# Patient Record
Sex: Female | Born: 1995 | Race: Black or African American | Hispanic: No | Marital: Single | Smoking: Former smoker
Health system: Southern US, Community
[De-identification: ages and names within clinical notes are randomized; demographics above are authoritative.]

## PROBLEM LIST (undated history)

## (undated) DIAGNOSIS — F32A Depression, unspecified: Secondary | ICD-10-CM

## (undated) DIAGNOSIS — F329 Major depressive disorder, single episode, unspecified: Secondary | ICD-10-CM

## (undated) DIAGNOSIS — J45909 Unspecified asthma, uncomplicated: Secondary | ICD-10-CM

## (undated) DIAGNOSIS — F319 Bipolar disorder, unspecified: Secondary | ICD-10-CM

---
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
## 2015-05-06 ENCOUNTER — Ambulatory Visit (HOSPITAL_COMMUNITY)
Admission: EM | Admit: 2015-05-06 | Discharge: 2015-05-06 | Disposition: A | Discharge status: Home / Self Care | Payer: Medicaid Other | Attending: Emergency Medicine | Admitting: Emergency Medicine

## 2015-05-06 ENCOUNTER — Encounter (HOSPITAL_COMMUNITY): Payer: Self-pay

## 2015-05-06 DIAGNOSIS — Z79899 Other long term (current) drug therapy: Secondary | ICD-10-CM | POA: Diagnosis not present

## 2015-05-06 DIAGNOSIS — Z87440 Personal history of urinary (tract) infections: Secondary | ICD-10-CM | POA: Insufficient documentation

## 2015-05-06 DIAGNOSIS — F329 Major depressive disorder, single episode, unspecified: Secondary | ICD-10-CM | POA: Insufficient documentation

## 2015-05-06 DIAGNOSIS — N898 Other specified noninflammatory disorders of vagina: Secondary | ICD-10-CM | POA: Insufficient documentation

## 2015-05-06 DIAGNOSIS — J45909 Unspecified asthma, uncomplicated: Secondary | ICD-10-CM | POA: Diagnosis not present

## 2015-05-06 DIAGNOSIS — Z888 Allergy status to other drugs, medicaments and biological substances status: Secondary | ICD-10-CM | POA: Insufficient documentation

## 2015-05-06 DIAGNOSIS — Z202 Contact with and (suspected) exposure to infections with a predominantly sexual mode of transmission: Secondary | ICD-10-CM

## 2015-05-06 LAB — POCT URINALYSIS DIP (DEVICE)
Bilirubin Urine: NEGATIVE
GLUCOSE, UA: NEGATIVE mg/dL
Ketones, ur: NEGATIVE mg/dL
Leukocytes, UA: NEGATIVE
NITRITE: NEGATIVE
PROTEIN: NEGATIVE mg/dL
SPECIFIC GRAVITY, URINE: 1.025 (ref 1.005–1.030)
UROBILINOGEN UA: 0.2 mg/dL (ref 0.0–1.0)
pH: 6.5 (ref 5.0–8.0)

## 2015-05-06 LAB — POCT PREGNANCY, URINE: PREG TEST UR: NEGATIVE

## 2015-05-06 MED ORDER — AZITHROMYCIN 250 MG PO TABS
ORAL_TABLET | ORAL | Status: AC
  Filled 2015-05-06: qty 4

## 2015-05-06 MED ORDER — LIDOCAINE HCL (PF) 1 % IJ SOLN
INTRAMUSCULAR | Status: AC
  Filled 2015-05-06: qty 5

## 2015-05-06 MED ORDER — CEFTRIAXONE SODIUM 250 MG IJ SOLR
INTRAMUSCULAR | Status: AC
  Filled 2015-05-06: qty 250

## 2015-05-06 MED ORDER — CEFTRIAXONE SODIUM 250 MG IJ SOLR
250.0000 mg | Freq: Once | INTRAMUSCULAR | Status: AC
  Administered 2015-05-06: 250 mg via INTRAMUSCULAR

## 2015-05-06 MED ORDER — AZITHROMYCIN 250 MG PO TABS
1000.0000 mg | ORAL_TABLET | Freq: Once | ORAL | Status: AC
  Administered 2015-05-06: 1000 mg via ORAL

## 2015-05-06 NOTE — ED Provider Notes (Signed)
CSN: [id number]     Arrival date & time 05/06/15  1818 History   First MD Initiated Contact with Patient 05/06/15 1921     Chief Complaint  Patient presents with  . Exposure to STD   (Consider location/radiation/quality/duration/timing/severity/associated sxs/prior Treatment) HPI  She is a 20 year old woman here forevaluation of vaginal discharge. She reports about one week of abdominal discomfort and discharge. The discharge is clear to white and runny with an odor. She denies any significant itching.Her boyfriend was recently diagnosed with gonorrhea. She was recently treated for a UTI.   Past Medical History  Diagnosis Date  . Depression   . Asthma    History reviewed. No pertinent past surgical history. No family history on file. Social History  Substance Use Topics  . Smoking status: Never Smoker   . Smokeless tobacco: Never Used  . Alcohol Use: Yes     Comment: occasional   OB History    Gravida Para Term Preterm AB TAB SAB Ectopic Multiple Living   1              Review of Systems as in history of present illness Allergies  Oxycodone  Home Medications   Prior to Admission medications   Medication Sig Start Date End Date Taking? Authorizing Provider  albuterol (PROVENTIL HFA;VENTOLIN HFA) 108 (90 BASE) MCG/ACT inhaler Inhale 2 puffs into the lungs every 6 (six) hours as needed. For shortness of breath   Yes Historical Provider, MD  beclomethasone (QVAR) 80 MCG/ACT inhaler Inhale 1 puff into the lungs daily.    Historical Provider, MD  cyclobenzaprine (FLEXERIL) 10 MG tablet Take 0.5-1 tablets (5-10 mg total) by mouth 2 (two) times daily as needed for muscle spasms. 11/25/13   Arthor CaptainAbigail Harris, PA-C  Divalproex Sodium (DEPAKOTE PO) Take 500 mg by mouth 3 (three) times daily.    Historical Provider, MD  FLUoxetine (PROZAC) 10 MG capsule Take 10 mg by mouth daily.    Historical Provider, MD  fluticasone (FLONASE) 50 MCG/ACT nasal spray Place 2 sprays into the nose daily.     Historical Provider, MD  hydrOXYzine (VISTARIL) 25 MG capsule Take 25 mg by mouth 4 (four) times daily as needed. For anxiety    Historical Provider, MD  naproxen (NAPROSYN) 500 MG tablet Take 1 tablet (500 mg total) by mouth 2 (two) times daily with a meal. 11/25/13   Arthor CaptainAbigail Harris, PA-C   Meds Ordered and Administered this Visit   Medications  azithromycin (ZITHROMAX) tablet 1,000 mg (1,000 mg Oral Given 05/06/15 1953)  cefTRIAXone (ROCEPHIN) injection 250 mg (250 mg Intramuscular Given 05/06/15 1954)    BP 119/84 mmHg  Pulse 93  Temp(Src) 98.9 F (37.2 C) (Oral)  Resp 16  SpO2 100%  LMP 05/02/2015 (Exact Date) No data found.   Physical Exam  Constitutional: She is oriented to person, place, and time. She appears well-developed and well-nourished. No distress.  Cardiovascular: Normal rate.   Pulmonary/Chest: Effort normal.  Genitourinary: There is no rash on the right labia. There is no rash on the left labia. Cervix exhibits no motion tenderness. There is bleeding (patient on period) in the vagina. No vaginal discharge found.  Neurological: She is alert and oriented to person, place, and time.    ED Course  Procedures (including critical care time)  Labs Review Labs Reviewed  POCT URINALYSIS DIP (DEVICE) - Abnormal; Notable for the following:    Hgb urine dipstick SMALL (*)    All other components within normal limits  CERVICOVAGINAL ANCILLARY ONLY    Imaging Review No results found.    MDM   1. STD exposure    Treated with Rocephin and azithromycin. Follow-up as needed.   Charm Rings, MD 05/06/15 [phone]

## 2015-05-06 NOTE — Discharge Instructions (Signed)
We treated you for gonorrhea and Chlamydia today. We will call you with your results in 2-3 days. Follow-up as needed.

## 2015-05-06 NOTE — ED Notes (Signed)
Patient presents with exposure to STD and would like to be tested, she is having lower abdominal pain and discharge x1 week. No acute distress

## 2015-05-08 LAB — URINE CULTURE

## 2015-05-09 LAB — CERVICOVAGINAL ANCILLARY ONLY
CHLAMYDIA, DNA PROBE: NEGATIVE
NEISSERIA GONORRHEA: NEGATIVE

## 2015-05-10 LAB — CERVICOVAGINAL ANCILLARY ONLY: WET PREP (BD AFFIRM): POSITIVE — AB

## 2015-05-11 ENCOUNTER — Telehealth (HOSPITAL_COMMUNITY): Payer: Self-pay | Admitting: Internal Medicine

## 2015-05-11 ENCOUNTER — Telehealth (HOSPITAL_COMMUNITY): Payer: Self-pay | Admitting: Emergency Medicine

## 2015-05-11 DIAGNOSIS — A599 Trichomoniasis, unspecified: Secondary | ICD-10-CM

## 2015-05-11 MED ORDER — METRONIDAZOLE 500 MG PO TABS: 500.0000 mg | ORAL_TABLET | Freq: Two times a day (BID) | ORAL | Status: AC

## 2015-05-11 NOTE — ED Notes (Signed)
Called [phone]... VM is full; unable to leave message Need to give lab results from recent visit on 4/21  Per Dr. Dayton ScrapeMurray,  Clinical staff, please let patient know that test for trichomonas was positive.  Rx for metronidazole was [address] the pharmacy of record, [facility] [address]  Recheck or followup pcp/Ariel Dorothyann GibbsFinch for further evaluation if symptoms persist. LM

## 2015-05-11 NOTE — ED Notes (Signed)
Clinical staff, please let patient know that test for trichomonas was positive.   Rx for metronidazole was sent to the pharmacy of record, [facility] [address]   Recheck or followup pcp/Lorri Dorothyann GibbsFinch for further evaluation if symptoms persist.  LM  Eustace MooreLaura W Rafaelita Foister, MD 05/11/15 0006

## 2015-05-20 NOTE — ED Notes (Unsigned)
Called [phone]... VM is full; unable to leave message Need to give lab results from recent visit on 4/21  Per Dr. Dayton ScrapeMurray,  Clinical staff, please let patient know that test for trichomonas was positive.  Rx for metronidazole was sent to the pharmacy of record, [facility] [address]  Recheck or followup pcp/Lorri Dorothyann GibbsFinch for further evaluation if symptoms persist. LM  Mailed letter as 3rd attempt

## 2016-02-27 ENCOUNTER — Emergency Department (HOSPITAL_BASED_OUTPATIENT_CLINIC_OR_DEPARTMENT_OTHER)
Admission: EM | Admit: 2016-02-27 | Discharge: 2016-02-27 | Disposition: A | Discharge status: Home / Self Care | Payer: Medicaid Other | Attending: Dermatology | Admitting: Dermatology

## 2016-02-27 ENCOUNTER — Encounter (HOSPITAL_BASED_OUTPATIENT_CLINIC_OR_DEPARTMENT_OTHER): Payer: Self-pay | Admitting: *Deleted

## 2016-02-27 ENCOUNTER — Emergency Department (HOSPITAL_BASED_OUTPATIENT_CLINIC_OR_DEPARTMENT_OTHER)
Admission: EM | Admit: 2016-02-27 | Discharge: 2016-02-27 | Disposition: A | Discharge status: Home / Self Care | Payer: Medicaid Other | Source: Home / Self Care

## 2016-02-27 DIAGNOSIS — Z5321 Procedure and treatment not carried out due to patient leaving prior to being seen by health care provider: Secondary | ICD-10-CM | POA: Insufficient documentation

## 2016-02-27 DIAGNOSIS — R21 Rash and other nonspecific skin eruption: Secondary | ICD-10-CM | POA: Insufficient documentation

## 2016-02-27 DIAGNOSIS — J45909 Unspecified asthma, uncomplicated: Secondary | ICD-10-CM | POA: Diagnosis not present

## 2016-02-27 DIAGNOSIS — Z791 Long term (current) use of non-steroidal anti-inflammatories (NSAID): Secondary | ICD-10-CM | POA: Insufficient documentation

## 2016-02-27 DIAGNOSIS — Z79899 Other long term (current) drug therapy: Secondary | ICD-10-CM | POA: Insufficient documentation

## 2016-02-27 DIAGNOSIS — Z4800 Encounter for change or removal of nonsurgical wound dressing: Secondary | ICD-10-CM | POA: Diagnosis present

## 2016-02-27 NOTE — ED Notes (Signed)
No answer in lobby.

## 2016-02-27 NOTE — ED Triage Notes (Signed)
States her right nipple ring is infected.

## 2016-02-27 NOTE — ED Notes (Signed)
Pt not in lobby.  

## 2016-02-27 NOTE — ED Triage Notes (Signed)
Right nipple ring is infected. She was here earlier and left without being seen to get her child.

## 2016-02-27 NOTE — ED Notes (Signed)
Pt does not answer in lobby. Pt did not let any one know she was leaving.

## 2016-02-27 NOTE — ED Notes (Signed)
No answer when called for a room. 

## 2016-02-27 NOTE — ED Notes (Signed)
Pt still not in lobby.

## 2016-09-24 ENCOUNTER — Encounter (HOSPITAL_COMMUNITY): Payer: Self-pay | Admitting: Emergency Medicine

## 2016-09-24 ENCOUNTER — Emergency Department (HOSPITAL_COMMUNITY): Payer: Medicaid Other

## 2016-09-24 ENCOUNTER — Emergency Department (HOSPITAL_COMMUNITY)
Admission: EM | Admit: 2016-09-24 | Discharge: 2016-09-24 | Disposition: A | Discharge status: Home / Self Care | Payer: Medicaid Other | Attending: Physician Assistant | Admitting: Physician Assistant

## 2016-09-24 DIAGNOSIS — Z23 Encounter for immunization: Secondary | ICD-10-CM | POA: Insufficient documentation

## 2016-09-24 DIAGNOSIS — J45909 Unspecified asthma, uncomplicated: Secondary | ICD-10-CM | POA: Insufficient documentation

## 2016-09-24 DIAGNOSIS — Y999 Unspecified external cause status: Secondary | ICD-10-CM | POA: Insufficient documentation

## 2016-09-24 DIAGNOSIS — Y929 Unspecified place or not applicable: Secondary | ICD-10-CM | POA: Insufficient documentation

## 2016-09-24 DIAGNOSIS — M25522 Pain in left elbow: Secondary | ICD-10-CM | POA: Insufficient documentation

## 2016-09-24 DIAGNOSIS — Z79899 Other long term (current) drug therapy: Secondary | ICD-10-CM | POA: Diagnosis not present

## 2016-09-24 DIAGNOSIS — Y9389 Activity, other specified: Secondary | ICD-10-CM | POA: Insufficient documentation

## 2016-09-24 DIAGNOSIS — R51 Headache: Secondary | ICD-10-CM | POA: Insufficient documentation

## 2016-09-24 DIAGNOSIS — R2242 Localized swelling, mass and lump, left lower limb: Secondary | ICD-10-CM | POA: Diagnosis not present

## 2016-09-24 HISTORY — DX: Bipolar disorder, unspecified: F31.9

## 2016-09-24 LAB — I-STAT BETA HCG BLOOD, ED (MC, WL, AP ONLY)

## 2016-09-24 LAB — ETHANOL: ALCOHOL ETHYL (B): 69 mg/dL — AB (ref <5)

## 2016-09-24 MED ORDER — OXYCODONE-ACETAMINOPHEN 5-325 MG PO TABS
1.0000 | ORAL_TABLET | ORAL | Status: DC | PRN
Start: 2016-09-24 — End: 2016-09-24
  Administered 2016-09-24: 1 via ORAL

## 2016-09-24 MED ORDER — IBUPROFEN 800 MG PO TABS: 800.0000 mg | ORAL_TABLET | Freq: Three times a day (TID) | ORAL | 0 refills | Status: DC

## 2016-09-24 MED ORDER — TETANUS-DIPHTH-ACELL PERTUSSIS 5-2.5-18.5 LF-MCG/0.5 IM SUSP
0.5000 mL | Freq: Once | INTRAMUSCULAR | Status: AC
  Administered 2016-09-24: 0.5 mL via INTRAMUSCULAR
  Filled 2016-09-24: qty 0.5

## 2016-09-24 MED ORDER — OXYCODONE-ACETAMINOPHEN 5-325 MG PO TABS
ORAL_TABLET | ORAL | Status: AC
  Filled 2016-09-24: qty 1
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
## 2016-09-24 NOTE — Discharge Instructions (Signed)
Return with any concerns. °
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
## 2016-09-24 NOTE — ED Notes (Signed)
Pt given narcotic pain medicine in triage. Advised of side effects and instructed to avoid driving for a minimum of four hours.  

## 2016-10-09 ENCOUNTER — Emergency Department (HOSPITAL_BASED_OUTPATIENT_CLINIC_OR_DEPARTMENT_OTHER)
Admission: EM | Admit: 2016-10-09 | Discharge: 2016-10-10 | Disposition: A | Discharge status: Home / Self Care | Payer: Medicaid Other | Attending: Emergency Medicine | Admitting: Emergency Medicine

## 2016-10-09 ENCOUNTER — Encounter (HOSPITAL_BASED_OUTPATIENT_CLINIC_OR_DEPARTMENT_OTHER): Payer: Self-pay

## 2016-10-09 DIAGNOSIS — Y999 Unspecified external cause status: Secondary | ICD-10-CM | POA: Diagnosis not present

## 2016-10-09 DIAGNOSIS — Y939 Activity, unspecified: Secondary | ICD-10-CM | POA: Diagnosis not present

## 2016-10-09 DIAGNOSIS — N760 Acute vaginitis: Secondary | ICD-10-CM | POA: Diagnosis not present

## 2016-10-09 DIAGNOSIS — J45909 Unspecified asthma, uncomplicated: Secondary | ICD-10-CM | POA: Diagnosis not present

## 2016-10-09 DIAGNOSIS — S3141XA Laceration without foreign body of vagina and vulva, initial encounter: Secondary | ICD-10-CM | POA: Diagnosis not present

## 2016-10-09 DIAGNOSIS — Z79899 Other long term (current) drug therapy: Secondary | ICD-10-CM | POA: Insufficient documentation

## 2016-10-09 DIAGNOSIS — X58XXXA Exposure to other specified factors, initial encounter: Secondary | ICD-10-CM | POA: Diagnosis not present

## 2016-10-09 DIAGNOSIS — F17210 Nicotine dependence, cigarettes, uncomplicated: Secondary | ICD-10-CM | POA: Insufficient documentation

## 2016-10-09 DIAGNOSIS — Y929 Unspecified place or not applicable: Secondary | ICD-10-CM | POA: Insufficient documentation

## 2016-10-09 DIAGNOSIS — B9689 Other specified bacterial agents as the cause of diseases classified elsewhere: Secondary | ICD-10-CM

## 2016-10-09 DIAGNOSIS — N898 Other specified noninflammatory disorders of vagina: Secondary | ICD-10-CM | POA: Diagnosis present

## 2016-10-09 LAB — URINALYSIS, ROUTINE W REFLEX MICROSCOPIC
Bilirubin Urine: NEGATIVE
Glucose, UA: NEGATIVE mg/dL
Hgb urine dipstick: NEGATIVE
Ketones, ur: NEGATIVE mg/dL
NITRITE: NEGATIVE
PH: 7.5 (ref 5.0–8.0)
Protein, ur: NEGATIVE mg/dL
SPECIFIC GRAVITY, URINE: 1.015 (ref 1.005–1.030)

## 2016-10-09 LAB — WET PREP, GENITAL
Sperm: NONE SEEN
Trich, Wet Prep: NONE SEEN
YEAST WET PREP: NONE SEEN

## 2016-10-09 LAB — URINALYSIS, MICROSCOPIC (REFLEX): RBC / HPF: NONE SEEN RBC/hpf (ref 0–5)

## 2016-10-09 LAB — PREGNANCY, URINE: Preg Test, Ur: NEGATIVE

## 2016-10-09 MED ORDER — CEFTRIAXONE SODIUM 250 MG IJ SOLR
250.0000 mg | Freq: Once | INTRAMUSCULAR | Status: AC
  Administered 2016-10-09: 250 mg via INTRAMUSCULAR
  Filled 2016-10-09: qty 250

## 2016-10-09 MED ORDER — AZITHROMYCIN 250 MG PO TABS
1000.0000 mg | ORAL_TABLET | Freq: Once | ORAL | Status: AC
  Administered 2016-10-09: 1000 mg via ORAL
  Filled 2016-10-09: qty 4

## 2016-10-09 MED ORDER — METRONIDAZOLE 500 MG PO TABS: 500.0000 mg | ORAL_TABLET | Freq: Two times a day (BID) | ORAL | 0 refills | Status: DC

## 2016-10-09 NOTE — ED Provider Notes (Signed)
[facility]   CSN: [id number] Arrival date & time: 10/09/16  2025     History   Chief Complaint Chief Complaint  Patient presents with  . Vaginal Discharge    HPI Ariel Randolph is a 21 y.o. female who presents with 4 days of vaginal discharge and irritation. Patient reports that she has been experiencing some white discharge for the last 4 days. She thought she was initially having yeast infection as she recently gets them. She reports that she is one over-the-counter yeast pill with minimal improvement in symptoms. Patient also reports some irritation to the lower left aspect of her vagina. She denies any rashes, sores. Patient reports that she is currently sexually active with multiple partners. She reports that she intermittently will use condoms. Patient does have an history of Chlamydia treated several years ago. Patient denies any fevers, chills, abdominal pain, nausea/vomiting, dysuria, hematuria, vaginal bleeding.  The history is provided by the patient.    Past Medical History:  Diagnosis Date  . Asthma   . Bipolar 1 disorder (HCC)   . Depression     There are no active problems to display for this patient.   History reviewed. No pertinent surgical history.  OB History    Gravida Para Term Preterm AB Living   1             SAB TAB Ectopic Multiple Live Births                   Home Medications    Prior to Admission medications   Medication Sig Start Date End Date Taking? Authorizing Provider  divalproex (DEPAKOTE ER) 500 MG 24 hr tablet Take 500 mg by mouth 2 (two) times daily. 06/28/16   [provider]  ibuprofen (ADVIL,MOTRIN) 800 MG tablet Take 1 tablet (800 mg total) by mouth 3 (three) times daily. 09/24/16   Mackuen, Courteney Lyn, MD  metroNIDAZOLE (FLAGYL) 500 MG tablet Take 1 tablet (500 mg total) by mouth 2 (two) times daily. 10/09/16   Maxwell Caul, PA-C    Family History No family history on  file.  Social History Social History  Substance Use Topics  . Smoking status: Current Every Day Smoker    Packs/day: 0.50    Types: Cigarettes  . Smokeless tobacco: Never Used  . Alcohol use Yes     Comment: occasional     Allergies   Oxycodone and Codeine   Review of Systems Review of Systems  Constitutional: Negative for fever.  Respiratory: Negative for shortness of breath.   Cardiovascular: Negative for chest pain.  Gastrointestinal: Negative for abdominal pain, nausea and vomiting.  Genitourinary: Positive for vaginal discharge. Negative for dysuria and hematuria.     Physical Exam Updated Vital Signs BP 134/67   Pulse 86   Temp 99.2 F (37.3 C)   Resp 20   Ht  (1.702 m)   Wt 102.1 kg (225 lb)   LMP 09/15/2016 (Within Days)   SpO2 100%   BMI 35.24 kg/m   Physical Exam  Constitutional: She is oriented to person, place, and time. She appears well-developed and well-nourished.  HENT:  Head: Normocephalic and atraumatic.  Mouth/Throat: Oropharynx is clear and moist and mucous membranes are normal.  Eyes: Pupils are equal, round, and reactive to light. Conjunctivae, EOM and lids are normal.  Neck: Full passive range of motion without pain.  Cardiovascular: Normal rate, regular rhythm, normal heart sounds and normal pulses.  Exam  reveals no gallop and no friction rub.   No murmur heard. Pulmonary/Chest: Effort normal and breath sounds normal.  Abdominal: Soft. Normal appearance. There is no tenderness. There is no rigidity and no guarding.  Genitourinary: Rectum normal and uterus normal. Rectal exam shows no fissure, no mass and no tenderness.    Cervix exhibits discharge. Cervix exhibits no motion tenderness and no friability. Right adnexum displays no mass and no tenderness. Left adnexum displays no mass and no tenderness. No bleeding in the vagina. There are signs of injury in the vagina.  Genitourinary Comments: The exam was performed with a chaperone  present. Normal external female genitalia. No lesions, rash, or sores. There is a small tear noted to the vaginal mucosa into the right lower aspect. The patient does have some mucoid discharge present. Cervix does not appear erythematous or friability. No CMT. No adnexal mass, tenderness bilaterally. Uterus is normal.   Musculoskeletal: Normal range of motion.  Neurological: She is alert and oriented to person, place, and time.  Skin: Skin is warm and dry. Capillary refill takes less than 2 seconds.  Psychiatric: She has a normal mood and affect. Her speech is normal.  Nursing note and vitals reviewed.    ED Treatments / Results  Labs (all labs ordered are listed, but only abnormal results are displayed) Labs Reviewed  WET PREP, GENITAL - Abnormal; Notable for the following:       Result Value   Clue Cells Wet Prep HPF POC PRESENT (*)    WBC, Wet Prep HPF POC MANY (*)    All other components within normal limits  URINALYSIS, ROUTINE W REFLEX MICROSCOPIC - Abnormal; Notable for the following:    APPearance CLOUDY (*)    Leukocytes, UA SMALL (*)    All other components within normal limits  URINALYSIS, MICROSCOPIC (REFLEX) - Abnormal; Notable for the following:    Bacteria, UA FEW (*)    Squamous Epithelial / LPF 6-30 (*)    All other components within normal limits  PREGNANCY, URINE  GC/CHLAMYDIA PROBE AMP ([place]) NOT AT [facility]    EKG  EKG Interpretation None       Radiology No results found.  Procedures Procedures (including critical care time)  Medications Ordered in ED Medications  cefTRIAXone (ROCEPHIN) injection 250 mg (250 mg Intramuscular Given 10/09/16 2252)  azithromycin (ZITHROMAX) tablet 1,000 mg (1,000 mg Oral Given 10/09/16 2251)     Initial Impression / Assessment and Plan / ED Course  I have reviewed the triage vital signs and the nursing notes.  Pertinent labs & imaging results that were available during my care of the patient were reviewed by me  and considered in my medical decision making (see chart for details).     21 year old female who presents with 4 days of vaginal irritation and discharge. No fevers chills, abdominal pain, nausea/vomiting, dysuria, hematuria. Patient is afebrile, non-toxic appearing, sitting comfortably on examination table. Vital signs reviewed and stable. Consider UTI versus STD versus PE versus pregnancy. Will plan to obtain a pelvic exam.  Pelvic exam as I can above. Pelvic exam is not concerning for PID, herpes. She does have some mucoid discharge present. There is a small tear to the lining of the vaginal mucosa on the right lower side. It does not require any suturing. It does not extend into the rectum. It is likely the cause of her burning sensation as physical exam is not concerning for herpes. Patient does wish to be treated for STDs  at this time. There is mention of multiple bumps in the triage note but patient denies any bumps or lesions during my interview. She states she is having one area of pain and "bump" where she points to the laceration.   Labs reviewed. Wet prep is positive for clue cells. Will plan to treat patient. Instructed patient on pelvic rest for symptomatic relief. Provided patient with a list of clinic resources to use if he does not have a PCP. Instructed to call them today to arrange follow-up in the next 24-48 hours. Strict return precautions discussed. Patient expresses understanding and agreement to plan.    Final Clinical Impressions(s) / ED Diagnoses   Final diagnoses:  BV (bacterial vaginosis)  Vaginal discharge  Laceration of vagina, initial encounter    New Prescriptions Discharge Medication List as of 10/09/2016 11:26 PM    START taking these medications   Details  metroNIDAZOLE (FLAGYL) 500 MG tablet Take 1 tablet (500 mg total) by mouth 2 (two) times daily., Starting Tue 10/09/2016, Print         Maxwell Caul, PA-C 10/11/16 0000    Jacalyn Lefevre,  MD 10/14/16 [phone]
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
## 2016-10-09 NOTE — Discharge Instructions (Signed)
Take Flagyl as directed.  It is very important that you do not consume any alcohol while taking this medication as it will cause you to become violently ill.  As we discussed, you need to engage in pelvic rest for the next week.   You have been treated today for an STD.   The test results with take 2-3 days to return. If there is an abnormal result, you will be notified. If you do not hear anything, that means the results were negative. You can also log on MyChart to see the results.   Your sexual partner needs to be treated too. Do not have sexual intercourse for the next 7 days and after your partner has been treated.   Follow-up with your primary care doctor in 2-4 days. If you do not have a primary care doctor, you can use one listed in the paperwork.   Return to the Emergency Department for any fever, abdominal pain, difficulty breathing, nausea/vomiting or any other worsening or concerning symptoms.

## 2016-10-09 NOTE — ED Triage Notes (Signed)
Pt reports vaginal discharge and itching x 2 days.

## 2016-10-11 LAB — GC/CHLAMYDIA PROBE AMP (~~LOC~~) NOT AT ARMC
CHLAMYDIA, DNA PROBE: NEGATIVE
Neisseria Gonorrhea: NEGATIVE

## 2017-02-25 ENCOUNTER — Encounter (HOSPITAL_BASED_OUTPATIENT_CLINIC_OR_DEPARTMENT_OTHER): Payer: Self-pay | Admitting: Emergency Medicine

## 2017-02-25 ENCOUNTER — Other Ambulatory Visit: Payer: Self-pay

## 2017-02-25 ENCOUNTER — Emergency Department (HOSPITAL_BASED_OUTPATIENT_CLINIC_OR_DEPARTMENT_OTHER)
Admission: EM | Admit: 2017-02-25 | Discharge: 2017-02-25 | Disposition: A | Discharge status: Home / Self Care | Payer: Medicaid Other | Attending: Emergency Medicine | Admitting: Emergency Medicine

## 2017-02-25 DIAGNOSIS — R0602 Shortness of breath: Secondary | ICD-10-CM | POA: Insufficient documentation

## 2017-02-25 DIAGNOSIS — Z5321 Procedure and treatment not carried out due to patient leaving prior to being seen by health care provider: Secondary | ICD-10-CM | POA: Diagnosis not present

## 2017-02-25 NOTE — ED Notes (Signed)
Called to re-vital. No answer.  Pt not in lobby.
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
## 2018-07-19 ENCOUNTER — Emergency Department (HOSPITAL_BASED_OUTPATIENT_CLINIC_OR_DEPARTMENT_OTHER)
Admission: EM | Admit: 2018-07-19 | Discharge: 2018-07-20 | Disposition: A | Discharge status: Home / Self Care | Payer: Medicaid Other | Attending: Emergency Medicine | Admitting: Emergency Medicine

## 2018-07-19 ENCOUNTER — Encounter (HOSPITAL_BASED_OUTPATIENT_CLINIC_OR_DEPARTMENT_OTHER): Payer: Self-pay | Admitting: *Deleted

## 2018-07-19 ENCOUNTER — Other Ambulatory Visit: Payer: Self-pay

## 2018-07-19 DIAGNOSIS — O26899 Other specified pregnancy related conditions, unspecified trimester: Secondary | ICD-10-CM

## 2018-07-19 DIAGNOSIS — R1030 Lower abdominal pain, unspecified: Secondary | ICD-10-CM | POA: Insufficient documentation

## 2018-07-19 DIAGNOSIS — N898 Other specified noninflammatory disorders of vagina: Secondary | ICD-10-CM | POA: Diagnosis not present

## 2018-07-19 DIAGNOSIS — R109 Unspecified abdominal pain: Secondary | ICD-10-CM

## 2018-07-19 DIAGNOSIS — O039 Complete or unspecified spontaneous abortion without complication: Secondary | ICD-10-CM | POA: Diagnosis not present

## 2018-07-19 DIAGNOSIS — O9989 Other specified diseases and conditions complicating pregnancy, childbirth and the puerperium: Secondary | ICD-10-CM | POA: Diagnosis not present

## 2018-07-19 DIAGNOSIS — Z3A01 Less than 8 weeks gestation of pregnancy: Secondary | ICD-10-CM | POA: Diagnosis not present

## 2018-07-19 LAB — WET PREP, GENITAL
Sperm: NONE SEEN
Trich, Wet Prep: NONE SEEN
Yeast Wet Prep HPF POC: NONE SEEN

## 2018-07-19 LAB — URINALYSIS, ROUTINE W REFLEX MICROSCOPIC
Bilirubin Urine: NEGATIVE
Glucose, UA: NEGATIVE mg/dL
Hgb urine dipstick: NEGATIVE
Ketones, ur: NEGATIVE mg/dL
Leukocytes,Ua: NEGATIVE
Nitrite: NEGATIVE
Protein, ur: NEGATIVE mg/dL
Specific Gravity, Urine: >1.03 — ABNORMAL HIGH (ref 1.005–1.030)
pH: 6 (ref 5.0–8.0)

## 2018-07-19 LAB — PREGNANCY, URINE: Preg Test, Ur: POSITIVE — AB

## 2018-07-19 LAB — HCG, QUANTITATIVE, PREGNANCY: hCG, Beta Chain, Quant, S: 69 m[IU]/mL — ABNORMAL HIGH (ref <5)

## 2018-07-19 MED ORDER — ACETAMINOPHEN 325 MG PO TABS
650.0000 mg | ORAL_TABLET | Freq: Once | ORAL | Status: AC
  Administered 2018-07-19: 650 mg via ORAL
  Filled 2018-07-19: qty 2

## 2018-07-19 NOTE — ED Provider Notes (Signed)
[facility] EMERGENCY DEPARTMENT Provider Note   CSN: [id number] Arrival date & time: 07/19/18  2242    History   Chief Complaint Chief Complaint  Patient presents with   Abdominal Pain    HPI Ariel Randolph is a 23 y.o. female.     HPI  This is a 23 year old female G2, P1 who presents with abdominal pain.  Patient reports that she had a positive pregnancy test on Wednesday.  Her last menstrual period was May 27.  She does state that she had "a spot of bleeding when I was supposed to get my period in June."  She has not had any subsequent bleeding.  She reports over the last hour she has had sharp bilateral lower abdominal pain.  It is nonradiating.  She rates her pain at 7 out of 10.  She has some increased vaginal discharge.  Denies any urinary symptoms or back pain.  She denies fever, nausea, vomiting.  She is O- and has had RhoGam during her prior pregnancies.  Past Medical History:  Diagnosis Date   Asthma    Bipolar 1 disorder ([place])    Depression     There are no active problems to display for this patient.   History reviewed. No pertinent surgical history.   OB History    Gravida  2   Para      Term      Preterm      AB      Living        SAB      TAB      Ectopic      Multiple      Live Births               Home Medications    Prior to Admission medications   Medication Sig Start Date End Date Taking? Authorizing Provider  divalproex (DEPAKOTE ER) 500 MG 24 hr tablet Take 500 mg by mouth 2 (two) times daily. 06/28/16   [provider]  ibuprofen (ADVIL,MOTRIN) 800 MG tablet Take 1 tablet (800 mg total) by mouth 3 (three) times daily. 09/24/16   Mackuen, Courteney Lyn, MD  metroNIDAZOLE (FLAGYL) 500 MG tablet Take 500 mg by mouth 2 (two) times daily.    [provider]    Family History No family history on file.  Social History Social History   Tobacco Use   Smoking status: Former Smoker   Packs/day: 0.50    Types: Cigarettes   Smokeless tobacco: Never Used  Substance Use Topics   Alcohol use: Not Currently    Comment: occasional   Drug use: Yes    Types: Marijuana     Allergies   Oxycodone and Codeine   Review of Systems Review of Systems  Constitutional: Negative for fever.  Respiratory: Negative for shortness of breath.   Cardiovascular: Negative for chest pain.  Gastrointestinal: Positive for abdominal pain. Negative for nausea and vomiting.  Genitourinary: Positive for vaginal discharge. Negative for dysuria, hematuria and vaginal bleeding.  Psychiatric/Behavioral: The patient is not nervous/anxious.   All other systems reviewed and are negative.    Physical Exam Updated Vital Signs BP 121/61 (BP Location: Right Arm)    Pulse 88    Temp 98.3 F (36.8 C) (Oral)    Resp 18    Ht 1.753 m (5\' 9" )    Wt 99.8 kg    LMP 06/11/2018    SpO2 100%    BMI 32.49 kg/m  Physical Exam Vitals signs and nursing note reviewed.  Constitutional:      Appearance: She is well-developed.     Comments: Overweight  HENT:     Head: Normocephalic and atraumatic.  Neck:     Musculoskeletal: Neck supple.  Cardiovascular:     Rate and Rhythm: Normal rate and regular rhythm.     Heart sounds: Normal heart sounds.  Pulmonary:     Effort: Pulmonary effort is normal. No respiratory distress.     Breath sounds: No wheezing.  Abdominal:     General: Bowel sounds are normal.     Palpations: Abdomen is soft.     Tenderness: There is no abdominal tenderness.  Genitourinary:    Vagina: Vaginal discharge present. No bleeding.     Cervix: Normal.     Comments: Normal external vaginal exam, moderate white vaginal discharge noted, no vaginal bleeding noted, no cervical motion tenderness, no adnexal tenderness or masses noted Skin:    General: Skin is warm and dry.  Neurological:     Mental Status: She is alert and oriented to person, place, and time.      ED Treatments /  Results  Labs (all labs ordered are listed, but only abnormal results are displayed) Labs Reviewed  WET PREP, GENITAL - Abnormal; Notable for the following components:      Result Value   Clue Cells Wet Prep HPF POC PRESENT (*)    WBC, Wet Prep HPF POC MANY (*)    All other components within normal limits  URINALYSIS, ROUTINE W REFLEX MICROSCOPIC - Abnormal; Notable for the following components:   APPearance CLOUDY (*)    Specific Gravity, Urine >1.030 (*)    All other components within normal limits  PREGNANCY, URINE - Abnormal; Notable for the following components:   Preg Test, Ur POSITIVE (*)    All other components within normal limits  HCG, QUANTITATIVE, PREGNANCY - Abnormal; Notable for the following components:   hCG, Beta Chain, Quant, S 69 (*)    All other components within normal limits  GC/CHLAMYDIA PROBE AMP ([place]) NOT AT [facility]    EKG None  Radiology No results found.  Procedures Procedures (including critical care time)  EMERGENCY DEPARTMENT US PREGNANCY "Study: Limited Ultrasound of the Pelvis for Pregnancy"  INDICATIONS:Pregnancy(required) Multiple views of the uterus and pelvic cavity were obtained in real-time with a multi-frequency probe.  APPROACH:Transabdominal  PERFORMED BY: Myself IMAGES ARCHIVED?: No LIMITATIONS: Emergent procedure PREGNANCY FREE FLUID: None ADNEXAL FINDINGS:Left ovary not seen and Right ovary not seen GESTATIONAL AGE, ESTIMATE:  none FETAL HEART RATE: none INTERPRETATION: no free fluid, no intrauterine preg noted     Medications Ordered in ED Medications  acetaminophen (TYLENOL) tablet 650 mg (650 mg Oral Given 07/19/18 2338)     Initial Impression / Assessment and Plan / ED Course  I have reviewed the triage vital signs and the nursing notes.  Pertinent labs & imaging results that were available during my care of the patient were reviewed by me and considered in my medical decision making (see chart for  details).        Patient presents with abdominal pain in the setting of a recent positive home pregnancy test.  She is overall nontoxic-appearing and vital signs are reassuring.  She has no objective tenderness on exam and no adnexal tenderness.  She has no obvious bleeding on exam.  She is Rh- and has required RhoGam in the past.  Urine pregnancy test is positive.  Urinalysis without evidence of infection.  Beta-hCG level 69.  Based on her last menstrual period she would be approximately 5 weeks and 4 days pregnant and I would expect this beta-hCG level to be higher.  Considerations include early normal pregnancy, early pregnancy loss or miscarriage, early ectopic pregnancy.  Given that she is nontender, have low suspicion for ectopic at this time.  Bedside ultrasound was limited to transabdominal.  At this time I do not visualize an intrauterine pregnancy but given low beta hCG, this would not necessarily be abnormal.  Reassuring that she has no obvious masses or pelvic free fluid.  We will have her return for a formal ultrasound later today.  Her abdominal exam remains benign.  I discussed with her all these possibilities.  Given that she does not have any bleeding on exam, do not feel RhoGam is indicated at this time.  She was instructed to return immediately if she has any new or worsening pain or lateralizing pain or significant bleeding.  Stated understanding.  After history, exam, and medical workup I feel the patient has been appropriately medically screened and is safe for discharge home. Pertinent diagnoses were discussed with the patient. Patient was given return precautions.   Final Clinical Impressions(s) / ED Diagnoses   Final diagnoses:  Abdominal pain affecting pregnancy    ED Discharge Orders         Ordered    US PELVIC COMPLETE WITH TRANSVAGINAL     07/20/18 0052           Shon BatonHorton, Zylie Mumaw F, MD 07/20/18 0100

## 2018-07-19 NOTE — ED Triage Notes (Addendum)
Lower abd pain today. Vaginal d/c x 1 week. Reports + home preg test this week

## 2018-07-20 ENCOUNTER — Encounter (HOSPITAL_COMMUNITY): Payer: Self-pay | Admitting: *Deleted

## 2018-07-20 ENCOUNTER — Ambulatory Visit (HOSPITAL_BASED_OUTPATIENT_CLINIC_OR_DEPARTMENT_OTHER)
Admission: RE | Admit: 2018-07-20 | Discharge: 2018-07-20 | Disposition: A | Discharge status: Home / Self Care | Payer: Medicaid Other | Source: Ambulatory Visit | Attending: Emergency Medicine | Admitting: Emergency Medicine

## 2018-07-20 ENCOUNTER — Inpatient Hospital Stay (EMERGENCY_DEPARTMENT_HOSPITAL)
Admission: EM | Admit: 2018-07-20 | Discharge: 2018-07-21 | Disposition: A | Discharge status: Home / Self Care | Payer: Medicaid Other | Source: Home / Self Care | Admitting: Family Medicine

## 2018-07-20 ENCOUNTER — Other Ambulatory Visit: Payer: Self-pay

## 2018-07-20 DIAGNOSIS — R109 Unspecified abdominal pain: Secondary | ICD-10-CM | POA: Diagnosis present

## 2018-07-20 DIAGNOSIS — Z6791 Unspecified blood type, Rh negative: Secondary | ICD-10-CM

## 2018-07-20 DIAGNOSIS — O039 Complete or unspecified spontaneous abortion without complication: Secondary | ICD-10-CM

## 2018-07-20 DIAGNOSIS — Z87891 Personal history of nicotine dependence: Secondary | ICD-10-CM | POA: Insufficient documentation

## 2018-07-20 DIAGNOSIS — Z3A01 Less than 8 weeks gestation of pregnancy: Secondary | ICD-10-CM

## 2018-07-20 LAB — CBC
HCT: 34.3 % — ABNORMAL LOW (ref 36.0–46.0)
Hemoglobin: 10 g/dL — ABNORMAL LOW (ref 12.0–15.0)
MCH: 20.7 pg — ABNORMAL LOW (ref 26.0–34.0)
MCHC: 29.2 g/dL — ABNORMAL LOW (ref 30.0–36.0)
MCV: 70.9 fL — ABNORMAL LOW (ref 80.0–100.0)
Platelets: 454 10*3/uL — ABNORMAL HIGH (ref 150–400)
RBC: 4.84 MIL/uL (ref 3.87–5.11)
RDW: 18.8 % — ABNORMAL HIGH (ref 11.5–15.5)
WBC: 5.7 10*3/uL (ref 4.0–10.5)
nRBC: 0 % (ref 0.0–0.2)

## 2018-07-20 LAB — HCG, QUANTITATIVE, PREGNANCY: hCG, Beta Chain, Quant, S: 42 m[IU]/mL — ABNORMAL HIGH (ref <5)

## 2018-07-20 MED ORDER — RHO D IMMUNE GLOBULIN 1500 UNIT/2ML IJ SOSY
300.0000 ug | PREFILLED_SYRINGE | Freq: Once | INTRAMUSCULAR | Status: AC
  Administered 2018-07-20: 300 ug via INTRAMUSCULAR
  Filled 2018-07-20: qty 2

## 2018-07-20 NOTE — MAU Provider Note (Signed)
History     CSN: [id number]  Arrival date and time: 07/20/18 2012   First Provider Initiated Contact with Patient 07/20/18 2151     Chief Complaint  Patient presents with  . Vaginal Bleeding   Ariel Randolph is a 23 y.o. G2P1 at 7149w4d by LMP who presents to MAU with complaints of vaginal bleeding. She was [facility] yesterday for abdominal pain and noted to have a HCG of 69. US performed today at [facility], nothing was seen on US (no IUP and no ectopic). She reports abdominal pain resolved last night prior to leaving the ED. She reports vaginal bleeding started today around 2000, describes as dark burgundy bleeding, denies clots passing. She is Rh negative and did not receive Rhogam yesterday in ED.   OB History    Gravida  2   Para  1   Term  1   Preterm      AB      Living  1     SAB      TAB      Ectopic      Multiple      Live Births             Past Medical History:  Diagnosis Date  . Asthma   . Bipolar 1 disorder (HCC)   . Depression     History reviewed. No pertinent surgical history.  No family history on file.  Social History   Tobacco Use  . Smoking status: Former Smoker    Packs/day: 0.50    Types: Cigarettes    Quit date: 07/11/2018    Years since quitting: 0.0  . Smokeless tobacco: Never Used  Substance Use Topics  . Alcohol use: Not Currently    Comment: occasional  . Drug use: Yes    Types: Marijuana    Comment: last use 1 July 20    Allergies:  Allergies  Allergen Reactions  . Oxycodone Hives  . Codeine Rash    Medications Prior to Admission  Medication Sig Dispense Refill Last Dose  . metroNIDAZOLE (FLAGYL) 500 MG tablet Take 500 mg by mouth 2 (two) times daily.   Past Month at Unknown time  . divalproex (DEPAKOTE ER) 500 MG 24 hr tablet Take 500 mg by mouth 2 (two) times daily.  2 More than a month at Unknown time  . ibuprofen (ADVIL,MOTRIN) 800 MG tablet Take 1 tablet (800 mg total) by mouth 3 (three)  times daily. 21 tablet 0 More than a month at Unknown time    Review of Systems  Constitutional: Negative.   Respiratory: Negative.   Cardiovascular: Negative.   Gastrointestinal: Negative.   Genitourinary: Positive for vaginal bleeding. Negative for difficulty urinating, dysuria, frequency, hematuria, pelvic pain and urgency.  Neurological: Negative.    Physical Exam   Blood pressure 131/81, pulse 79, temperature 98.4 F (36.9 C), temperature source Oral, resp. rate 16, height 5\' 9"  (1.753 m), weight 95.7 kg, last menstrual period 06/11/2018.  Physical Exam  Nursing note and vitals reviewed. Constitutional: She is oriented to person, place, and time. She appears well-developed and well-nourished. No distress.  Cardiovascular: Normal rate and regular rhythm.  Respiratory: Effort normal and breath sounds normal. No respiratory distress. She has no wheezes.  GI: Soft. She exhibits no distension. There is no abdominal tenderness. There is no rebound and no guarding.  Genitourinary:    Vaginal bleeding present.  There is bleeding in the vagina.    Genitourinary Comments:  Pelvic exam: Cervix pink, visually closed, without lesion,  Small amount of bright red vaginal bleeding present, vaginal walls and external genitalia normal Bimanual exam: Cervix 0/long/high, firm, anterior, neg CMT, uterus nontender, nonenlarged, adnexa without tenderness, enlargement, or mass   Musculoskeletal: Normal range of motion.        General: No edema.  Neurological: She is alert and oriented to person, place, and time.  Psychiatric: She has a normal mood and affect. Her behavior is normal. Thought content normal.   MAU Course  Procedures  MDM Orders Placed This Encounter  Procedures  . hCG, quantitative, pregnancy  . CBC  . Rh IG workup (includes ABO/Rh)   Meds ordered this encounter  Medications  . rho (d) immune globulin (RHIG/RHOPHYLAC) injection 300 mcg   D/t recent labs and cultures last night  in ED no repeat wet prep, GC collected. Repeat HCG ordered to assess for decreased levels, even if HCG increase unable to determine success of pregnancy d/t less than 48 hours in between labs.   RHOgam workup and Rhogam given to patient prior to discharge home.  Labs reviewed:  Results for orders placed or performed during the hospital encounter of 07/20/18 (from the past 24 hour(s))  hCG, quantitative, pregnancy     Status: Abnormal   Collection Time: 07/20/18 10:12 PM  Result Value Ref Range   hCG, Beta Chain, Quant, S 42 (H) <5 mIU/mL  Rh IG workup (includes ABO/Rh)     Status: None (Preliminary result)   Collection Time: 07/20/18 10:12 PM  Result Value Ref Range   Gestational Age(Wks) 5    ABO/RH(D) B NEG    Antibody Screen NEG    Unit Number [id number]    Blood Component Type RHIG    Unit division 00    Status of Unit ISSUED    Transfusion Status      OK TO TRANSFUSE Performed at [facility], [address] [place], [place] [address]   CBC     Status: Abnormal   Collection Time: 07/20/18 10:12 PM  Result Value Ref Range   WBC 5.7 4.0 - 10.5 K/uL   RBC 4.84 3.87 - 5.11 MIL/uL   Hemoglobin 10.0 (L) 12.0 - 15.0 g/dL   HCT 30.834.3 (L) 65.736.0 - 84.646.0 %   MCV 70.9 (L) 80.0 - 100.0 fL   MCH 20.7 (L) 26.0 - 34.0 pg   MCHC 29.2 (L) 30.0 - 36.0 g/dL   RDW 96.218.8 (H) 95.211.5 - 84.115.5 %   Platelets 454 (H) 150 - 400 K/uL   nRBC 0.0 0.0 - 0.2 %   HCG dropped from 69 > 42, confirmed miscarriage with current vaginal bleeding. Educated and discussed miscarriage with patient, Patient tearful as expected, comfort and support giving  Educated on what to expect with vaginal bleeding and abdominal pain. Discussed reasons to return to MAU. Follow up in the office in 2 weeks following miscarriage, message sent to office for scheduling.   Patient stable at time of discharge, Rhogam given prior to discharge.   Assessment and Plan   1. SAB (spontaneous abortion)   2. Rh negative state in  antepartum period    Discharge home Follow up in 2 weeks in the office following miscarriage  Return to MAU as needed for reasons discussed Miscarriage precautions   Follow-up Information    [facility] Follow up in 2 week(s).   Specialty: Obstetrics and Gynecology Why: Follow up in 2 weeks, office should call  you by wednesday if you do not hear from them then call them on Thursday at the number above  Contact information: [address], [place] [id number] [place] [address] [phone]         Allergies as of 07/20/2018      Reactions   Oxycodone Hives   Codeine Rash      Medication List    TAKE these medications   divalproex 500 MG 24 hr tablet Commonly known as: DEPAKOTE ER Take 500 mg by mouth 2 (two) times daily.   ibuprofen 800 MG tablet Commonly known as: ADVIL Take 1 tablet (800 mg total) by mouth 3 (three) times daily.   metroNIDAZOLE 500 MG tablet Commonly known as: FLAGYL Take 500 mg by mouth 2 (two) times daily.      Lajean Manes CNM 07/20/2018, 11:31 PM

## 2018-07-20 NOTE — Discharge Instructions (Addendum)
You were seen today for abdominal pain during pregnancy.  Your pain improved.  Your pregnancy test is positive however, your blood levels are fairly low at a beta hCG of 69.  There are several possibilities.  This could represent a normal early pregnancy.  However, given your dating, this could also indicate an early pregnancy loss or an ectopic pregnancy.  Given that your pain does not lateralize, at this time I have low suspicion for an ectopic pregnancy.  However, please return later today for an ultrasound.  You will need repeat hCG levels and need to call your OB/GYN on Monday for this.  Sure to take your prenatal vitamin.  If you develop worsening pain on one side or the other, you need to be reevaluated immediately and should go to [facility].

## 2018-07-20 NOTE — ED Notes (Signed)
ED Provider at bedside. 

## 2018-07-20 NOTE — Discharge Instructions (Signed)
Miscarriage °A miscarriage is the loss of an unborn baby (fetus) before the 20th week of pregnancy. °Follow these instructions at home: °Medicines ° °· Take over-the-counter and prescription medicines only as told by your doctor. °· If you were prescribed antibiotic medicine, take it as told by your doctor. Do not stop taking the antibiotic even if you start to feel better. °· Do not take NSAIDs unless your doctor says that this is safe for you. NSAIDs include aspirin and ibuprofen. These medicines can cause bleeding. °Activity °· Rest as directed. Ask your doctor what activities are safe for you. °· Have someone help you at home during this time. °General instructions °· Write down how many pads you use each day and how soaked they are. °· Watch the amount of tissue or clumps of blood (blood clots) that you pass from your vagina. Save any large amounts of tissue for your doctor. °· Do not use tampons, douche, or have sex until your doctor approves. °· To help you and your partner with the process of grieving, talk with your doctor or seek counseling. °· When you are ready, meet with your doctor to talk about steps you should take for your health. Also, talk with your doctor about steps to take to have a healthy pregnancy in the future. °· Keep all follow-up visits as told by your doctor. This is important. °Contact a doctor if: °· You have a fever or chills. °· You have vaginal discharge that smells bad. °· You have more bleeding. °Get help right away if: °· You have very bad cramps or pain in your back or belly. °· You pass clumps of blood that are walnut-sized or larger from your vagina. °· You pass tissue that is walnut-sized or larger from your vagina. °· You soak more than 1 regular pad in an hour. °· You get light-headed or weak. °· You faint (pass out). °· You have feelings of sadness that do not go away, or you have thoughts of hurting yourself. °Summary °· A miscarriage is the loss of an unborn baby before  the 20th week of pregnancy. °· Follow your doctor's instructions for home care. Keep all follow-up appointments. °· To help you and your partner with the process of grieving, talk with your doctor or seek counseling. °This information is not intended to replace advice given to you by your health care provider. Make sure you discuss any questions you have with your health care provider. °Document Released: 03/26/2011 Document Revised: 04/25/2018 Document Reviewed: 02/07/2016 °Elsevier Patient Education © 2020 [employer] Inc[employer] ° °

## 2018-07-20 NOTE — MAU Note (Signed)
Pt c/o scant burgundy bleeding starting one hour ago. She states having an ultrasound today at [facility] at 1100 this morning. States she has very mild cramps in her lower abd.

## 2018-07-21 LAB — RH IG WORKUP (INCLUDES ABO/RH)
ABO/RH(D): B NEG
Antibody Screen: NEGATIVE
Gestational Age(Wks): 5
Unit division: 0

## 2018-07-22 LAB — GC/CHLAMYDIA PROBE AMP (~~LOC~~) NOT AT ARMC
Chlamydia: NEGATIVE
Neisseria Gonorrhea: NEGATIVE

## 2019-08-31 ENCOUNTER — Other Ambulatory Visit: Payer: Self-pay

## 2019-08-31 ENCOUNTER — Emergency Department (HOSPITAL_BASED_OUTPATIENT_CLINIC_OR_DEPARTMENT_OTHER)
Admission: EM | Admit: 2019-08-31 | Discharge: 2019-08-31 | Disposition: A | Discharge status: Home / Self Care | Payer: Medicaid Other | Attending: Emergency Medicine | Admitting: Emergency Medicine

## 2019-08-31 ENCOUNTER — Encounter (HOSPITAL_BASED_OUTPATIENT_CLINIC_OR_DEPARTMENT_OTHER): Payer: Self-pay | Admitting: *Deleted

## 2019-08-31 DIAGNOSIS — J029 Acute pharyngitis, unspecified: Secondary | ICD-10-CM | POA: Diagnosis not present

## 2019-08-31 DIAGNOSIS — Z20822 Contact with and (suspected) exposure to covid-19: Secondary | ICD-10-CM | POA: Insufficient documentation

## 2019-08-31 DIAGNOSIS — Z87891 Personal history of nicotine dependence: Secondary | ICD-10-CM | POA: Insufficient documentation

## 2019-08-31 DIAGNOSIS — J45909 Unspecified asthma, uncomplicated: Secondary | ICD-10-CM | POA: Insufficient documentation

## 2019-08-31 LAB — SARS CORONAVIRUS 2 BY RT PCR (HOSPITAL ORDER, PERFORMED IN ~~LOC~~ HOSPITAL LAB): SARS Coronavirus 2: NEGATIVE

## 2019-08-31 NOTE — ED Provider Notes (Signed)
[facility] EMERGENCY DEPARTMENT Provider Note   CSN: [id number] Arrival date & time: 08/31/19  1700     History Chief Complaint  Patient presents with  . Sore Throat    Ariel Randolph is a 24 y.o. female.  She is complaining of a sore throat for 3 to 4 days dry sensation not associate with any cough.  Has had some sweats but no documented fevers.  No cough no body aches no nausea vomiting diarrhea no lack of taste or smell.  Has not received her vaccine.  Just wanted to make sure she did not Covid.  The history is provided by the patient.  Sore Throat This is a new problem. The current episode started more than 2 days ago. The problem occurs constantly. The problem has not changed since onset.Pertinent negatives include no chest pain, no abdominal pain and no shortness of breath. The symptoms are aggravated by swallowing. Nothing relieves the symptoms. She has tried water for the symptoms. The treatment provided no relief.       Past Medical History:  Diagnosis Date  . Asthma   . Bipolar 1 disorder (HCC)   . Depression     There are no problems to display for this patient.   History reviewed. No pertinent surgical history.   OB History    Gravida  2   Para  1   Term  1   Preterm      AB      Living  1     SAB      TAB      Ectopic      Multiple      Live Births              No family history on file.  Social History   Tobacco Use  . Smoking status: Former Smoker    Packs/day: 0.50    Types: Cigarettes    Quit date: 07/11/2018    Years since quitting: 1.1  . Smokeless tobacco: Never Used  Vaping Use  . Vaping Use: Former  Substance Use Topics  . Alcohol use: Not Currently    Comment: occasional  . Drug use: Yes    Types: Marijuana    Comment: last use 1 July 20    Home Medications Prior to Admission medications   Medication Sig Start Date End Date Taking? Authorizing Provider  divalproex (DEPAKOTE ER) 500 MG 24 hr tablet  Take 500 mg by mouth 2 (two) times daily. 06/28/16   [provider]  ibuprofen (ADVIL,MOTRIN) 800 MG tablet Take 1 tablet (800 mg total) by mouth 3 (three) times daily. 09/24/16   Mackuen, Courteney Lyn, MD  metroNIDAZOLE (FLAGYL) 500 MG tablet Take 500 mg by mouth 2 (two) times daily.    [provider]    Allergies    Oxycodone and Codeine  Review of Systems   Review of Systems  Constitutional: Negative for fever.  HENT: Positive for sore throat.   Eyes: Negative for visual disturbance.  Respiratory: Negative for shortness of breath.   Cardiovascular: Negative for chest pain.  Gastrointestinal: Negative for abdominal pain.  Genitourinary: Negative for dysuria.  Musculoskeletal: Negative for neck pain.  Skin: Negative for rash.  Neurological: Negative for speech difficulty.    Physical Exam Updated Vital Signs BP (!) 153/89 (BP Location: Right Arm)   Pulse 79   Temp 98.5 F (36.9 C) (Oral)   Resp 16   Ht 5\' 9"  (1.753 m)  Wt 81.6 kg   LMP 08/24/2019   SpO2 100%   BMI 26.58 kg/m   Physical Exam Vitals and nursing note reviewed.  Constitutional:      General: She is not in acute distress.    Appearance: She is well-developed.  HENT:     Head: Normocephalic and atraumatic.     Mouth/Throat:     Mouth: Mucous membranes are moist. No oral lesions.     Pharynx: Posterior oropharyngeal erythema (minimal) present. No pharyngeal swelling, oropharyngeal exudate or uvula swelling.     Tonsils: No tonsillar exudate or tonsillar abscesses.  Eyes:     Conjunctiva/sclera: Conjunctivae normal.  Cardiovascular:     Rate and Rhythm: Normal rate and regular rhythm.     Heart sounds: No murmur heard.   Pulmonary:     Effort: Pulmonary effort is normal. No respiratory distress.     Breath sounds: Normal breath sounds. No stridor. No wheezing.  Abdominal:     Palpations: Abdomen is soft.     Tenderness: There is no abdominal tenderness.  Musculoskeletal:         General: No tenderness. Normal range of motion.     Cervical back: Neck supple.  Skin:    General: Skin is warm and dry.     Capillary Refill: Capillary refill takes less than 2 seconds.  Neurological:     General: No focal deficit present.     Mental Status: She is alert.     GCS: GCS eye subscore is 4. GCS verbal subscore is 5. GCS motor subscore is 6.     ED Results / Procedures / Treatments   Labs (all labs ordered are listed, but only abnormal results are displayed) Labs Reviewed  SARS CORONAVIRUS 2 BY RT PCR (HOSPITAL ORDER, PERFORMED IN [facility])  GROUP A STREP BY PCR    EKG None  Radiology No results found.  Procedures Procedures (including critical care time)  Medications Ordered in ED Medications - No data to display  ED Course  I have reviewed the triage vital signs and the nursing notes.  Pertinent labs & imaging results that were available during my care of the patient were reviewed by me and considered in my medical decision making (see chart for details).    MDM Rules/Calculators/A&P                         Ariel Randolph was evaluated in Emergency Department on 08/31/2019 for the symptoms described in the history of present illness. She was evaluated in the context of the global COVID-19 pandemic, which necessitated consideration that the patient might be at risk for infection with the SARS-CoV-2 virus that causes COVID-19. Institutional protocols and algorithms that pertain to the evaluation of patients at risk for COVID-19 are in a state of rapid change based on information released by regulatory bodies including the CDC and federal and state organizations. These policies and algorithms were followed during the patient's care in the ED.   Final Clinical Impression(s) / ED Diagnoses Final diagnoses:  Sore throat    Rx / DC Orders ED Discharge Orders    None       Terrilee Files, MD 09/01/19 1018

## 2019-08-31 NOTE — Discharge Instructions (Addendum)
You were seen in the emergency department for 4 days of a sore throat.  Your Covid testing was negative.  We did a rapid strep that was pending at time of discharge.  We will contact you if it is positive and you need to be on antibiotics.  Please use Tylenol and ibuprofen for pain and fever.  Warm salt water gargles.  Follow-up with your doctor.  Please consider getting your Covid vaccine.

## 2019-08-31 NOTE — ED Triage Notes (Signed)
C/o sore throat  x 4 days.

## 2019-10-16 ENCOUNTER — Emergency Department (HOSPITAL_BASED_OUTPATIENT_CLINIC_OR_DEPARTMENT_OTHER): Payer: Medicaid Other

## 2019-10-16 ENCOUNTER — Other Ambulatory Visit: Payer: Self-pay

## 2019-10-16 ENCOUNTER — Encounter (HOSPITAL_BASED_OUTPATIENT_CLINIC_OR_DEPARTMENT_OTHER): Payer: Self-pay | Admitting: Emergency Medicine

## 2019-10-16 ENCOUNTER — Emergency Department (HOSPITAL_BASED_OUTPATIENT_CLINIC_OR_DEPARTMENT_OTHER)
Admission: EM | Admit: 2019-10-16 | Discharge: 2019-10-16 | Disposition: A | Discharge status: Home / Self Care | Payer: Medicaid Other | Attending: Emergency Medicine | Admitting: Emergency Medicine

## 2019-10-16 DIAGNOSIS — Z87891 Personal history of nicotine dependence: Secondary | ICD-10-CM | POA: Insufficient documentation

## 2019-10-16 DIAGNOSIS — J45909 Unspecified asthma, uncomplicated: Secondary | ICD-10-CM | POA: Insufficient documentation

## 2019-10-16 DIAGNOSIS — M79601 Pain in right arm: Secondary | ICD-10-CM | POA: Diagnosis not present

## 2019-10-16 MED ORDER — ACETAMINOPHEN 325 MG PO TABS
650.0000 mg | ORAL_TABLET | Freq: Once | ORAL | Status: AC
  Administered 2019-10-16: 14:00:00 650 mg via ORAL
  Filled 2019-10-16: qty 2

## 2019-10-16 MED ORDER — LIDOCAINE 5 % EX PTCH: 1.0000 | MEDICATED_PATCH | CUTANEOUS | 0 refills | Status: AC

## 2019-10-16 MED ORDER — METHOCARBAMOL 500 MG PO TABS: 500.0000 mg | ORAL_TABLET | Freq: Two times a day (BID) | ORAL | 0 refills | Status: AC

## 2019-10-16 MED ORDER — METHOCARBAMOL 500 MG PO TABS: 500.0000 mg | ORAL_TABLET | Freq: Once | ORAL | Status: DC
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
## 2019-10-16 NOTE — Discharge Instructions (Signed)
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
## 2023-02-14 ENCOUNTER — Other Ambulatory Visit: Payer: Self-pay

## 2023-02-14 ENCOUNTER — Emergency Department (HOSPITAL_BASED_OUTPATIENT_CLINIC_OR_DEPARTMENT_OTHER)
Admission: EM | Admit: 2023-02-14 | Discharge: 2023-02-14 | Disposition: A | Discharge status: Home / Self Care | Payer: MEDICAID | Attending: Emergency Medicine | Admitting: Emergency Medicine

## 2023-02-14 ENCOUNTER — Other Ambulatory Visit (HOSPITAL_BASED_OUTPATIENT_CLINIC_OR_DEPARTMENT_OTHER): Payer: Self-pay

## 2023-02-14 ENCOUNTER — Encounter (HOSPITAL_BASED_OUTPATIENT_CLINIC_OR_DEPARTMENT_OTHER): Payer: Self-pay | Admitting: Emergency Medicine

## 2023-02-14 ENCOUNTER — Emergency Department (HOSPITAL_BASED_OUTPATIENT_CLINIC_OR_DEPARTMENT_OTHER): Payer: MEDICAID

## 2023-02-14 DIAGNOSIS — S62306A Unspecified fracture of fifth metacarpal bone, right hand, initial encounter for closed fracture: Secondary | ICD-10-CM | POA: Diagnosis not present

## 2023-02-14 DIAGNOSIS — S62339A Displaced fracture of neck of unspecified metacarpal bone, initial encounter for closed fracture: Secondary | ICD-10-CM

## 2023-02-14 DIAGNOSIS — W2209XA Striking against other stationary object, initial encounter: Secondary | ICD-10-CM | POA: Insufficient documentation

## 2023-02-14 DIAGNOSIS — S6991XA Unspecified injury of right wrist, hand and finger(s), initial encounter: Secondary | ICD-10-CM | POA: Diagnosis present

## 2023-02-14 MED ORDER — HYDROCODONE-ACETAMINOPHEN 5-325 MG PO TABS: 2.0000 | ORAL_TABLET | Freq: Once | ORAL | Status: DC

## 2023-02-14 MED ORDER — HYDROCODONE-ACETAMINOPHEN 5-325 MG PO TABS
2.0000 | ORAL_TABLET | Freq: Four times a day (QID) | ORAL | 0 refills | Status: AC | PRN
  Filled 2023-02-14: qty 15, 2d supply, fill #0

## 2023-02-14 NOTE — Discharge Instructions (Signed)
As we discussed, you have a fracture over the fifth finger which is likely the source of your pain.  Please keep the splint on and follow-up with hand specialist in 1 week.  I have provided a number and call to schedule an appointment.  I have also given you some narcotic pain medication to take as well.  You can also take a 600 mg of ibuprofen every 6 hours and take the narcotic pain medication if you are still experiencing severe pain after taking this.  May return to the emergency department for any worsening symptoms.

## 2023-02-14 NOTE — ED Provider Notes (Signed)
[place] EMERGENCY DEPARTMENT AT [facility] Provider Note   CSN: [id number] Arrival date & time: 02/14/23  0825     History Chief Complaint  Patient presents with   Hand Injury    Ariel Randolph is a 28 y.o. female patient who presents to the emergency department today for further evaluation of a right hand injury that occurred this morning after punching a wooden door.  She denies any other injury.  Pain is localized to the lateral aspect of the right hand.   Hand Injury      Home Medications Prior to Admission medications   Medication Sig Start Date End Date Taking? Authorizing Provider  HYDROcodone-acetaminophen (NORCO/VICODIN) 5-325 MG tablet Take 2 tablets by mouth every 6 (six) hours as needed. 02/14/23  Yes Meredeth Ide, Mirella Gueye M, PA-C  divalproex (DEPAKOTE ER) 500 MG 24 hr tablet Take 500 mg by mouth 2 (two) times daily. 06/28/16   [provider]  ibuprofen (ADVIL,MOTRIN) 800 MG tablet Take 1 tablet (800 mg total) by mouth 3 (three) times daily. 09/24/16   Mackuen, Courteney Lyn, MD  lidocaine (LIDODERM) 5 % Place 1 patch onto the skin daily. Remove & Discard patch within 12 hours or as directed by MD 10/16/19   Henderly, Britni A, PA-C  methocarbamol (ROBAXIN) 500 MG tablet Take 1 tablet (500 mg total) by mouth 2 (two) times daily. 10/16/19   Henderly, Britni A, PA-C  metroNIDAZOLE (FLAGYL) 500 MG tablet Take 500 mg by mouth 2 (two) times daily.    [provider]      Allergies    Oxycodone and Codeine    Review of Systems   Review of Systems  All other systems reviewed and are negative.   Physical Exam Updated Vital Signs BP 136/89 (BP Location: Left Arm)   Pulse 100   Temp 99.3 F (37.4 C) (Oral)   Resp 20   Ht 5\' 8"  (1.727 m)   Wt 79.8 kg   LMP 01/31/2023   SpO2 100%   Breastfeeding No   BMI 26.76 kg/m  Physical Exam Vitals and nursing note reviewed.  Constitutional:      Appearance: Normal appearance.  HENT:     Head:  Normocephalic and atraumatic.  Eyes:     General:        Right eye: No discharge.        Left eye: No discharge.     Conjunctiva/sclera: Conjunctivae normal.  Pulmonary:     Effort: Pulmonary effort is normal.  Musculoskeletal:     Comments: There is some abrasions to the dorsal aspect of the right hand.  Bleeding is controlled.  2+ radial pulse felt in the right wrist.  There is some moderate bruising over the fourth and fifth metacarpals and swelling.  Sensation is intact.  Good cap refill distally.  Skin:    General: Skin is warm and dry.     Findings: No rash.  Neurological:     General: No focal deficit present.     Mental Status: She is alert.  Psychiatric:        Mood and Affect: Mood normal.        Behavior: Behavior normal.     ED Results / Procedures / Treatments   Labs (all labs ordered are listed, but only abnormal results are displayed) Labs Reviewed - No data to display  EKG None  Radiology DG Hand Complete Right Result Date: 02/14/2023 CLINICAL DATA:  Punched a wall this morning. Injury. 4th  and fifth metacarpal pain. EXAM: RIGHT HAND - COMPLETE 3+ VIEW COMPARISON:  None Available. FINDINGS: There is an acute, transverse fracture of the fifth metacarpal neck with mild medial/ulnar and mild-to-moderate dorsal apex angulation. No intra-articular extension. Joint spaces are preserved. No dislocation. IMPRESSION: Acute, transverse fracture of the fifth metacarpal neck with mild medial/ulnar and mild-to-moderate dorsal apex angulation. Electronically Signed   By: Neita Garnet M.D.   On: 02/14/2023 09:38    Procedures Procedures    Medications Ordered in ED Medications  HYDROcodone-acetaminophen (NORCO/VICODIN) 5-325 MG per tablet 2 tablet (0 tablets Oral Hold 02/14/23 1135)    ED Course/ Medical Decision Making/ A&P Clinical Course as of 02/14/23 1204  Thu Feb 14, 2023  1200 Ulnar gutter splint applied.  Good cap refill.  Sensation is intact. [CF]  1204 DG  Hand Complete Right I personally ordered interpreted the study.  There is evidence of a boxer's fracture. [CF]    Clinical Course User Index [CF] Teressa Lower, PA-C   {   Click here for ABCD2, HEART and other calculators  Medical Decision Making [name] is a 28 y.o. female patient who presents to the emergency department today for further evaluation of right hand pain.  Based on imaging there does appear to be a boxer's fracture over the right hand.  Will likely place an ulnar gutter splint and have her follow-up with orthopedics.  Sensation is intact.  Will give her some Norco for pain here.  Patient agreeable with plan.  Strict return precautions were discussed.  She will be safer discharge after ulnar gutter splint.   Amount and/or Complexity of Data Reviewed Radiology: ordered.  Risk Prescription drug management.    Final Clinical Impression(s) / ED Diagnoses Final diagnoses:  Closed boxer's fracture, initial encounter    Rx / DC Orders ED Discharge Orders          Ordered    HYDROcodone-acetaminophen (NORCO/VICODIN) 5-325 MG tablet  Every 6 hours PRN        02/14/23 1201              Honor Loh [place], [place] 02/14/23 1204    Tegeler, Canary Brim, MD 02/14/23 1259

## 2023-02-14 NOTE — ED Triage Notes (Signed)
Right hand injury this am.  Pt hit wooden door.  No obvious deformities noted.

## 2023-07-09 ENCOUNTER — Other Ambulatory Visit: Payer: Self-pay

## 2023-07-09 ENCOUNTER — Encounter (HOSPITAL_BASED_OUTPATIENT_CLINIC_OR_DEPARTMENT_OTHER): Payer: Self-pay | Admitting: Emergency Medicine

## 2023-07-09 ENCOUNTER — Emergency Department (HOSPITAL_BASED_OUTPATIENT_CLINIC_OR_DEPARTMENT_OTHER)
Admission: EM | Admit: 2023-07-09 | Discharge: 2023-07-09 | Disposition: A | Discharge status: Home / Self Care | Payer: MEDICAID | Attending: Emergency Medicine | Admitting: Emergency Medicine

## 2023-07-09 DIAGNOSIS — R519 Headache, unspecified: Secondary | ICD-10-CM | POA: Insufficient documentation

## 2023-07-09 DIAGNOSIS — K0889 Other specified disorders of teeth and supporting structures: Secondary | ICD-10-CM | POA: Insufficient documentation

## 2023-07-09 MED ORDER — PENICILLIN V POTASSIUM 500 MG PO TABS: 500.0000 mg | ORAL_TABLET | Freq: Four times a day (QID) | ORAL | 0 refills | Status: AC

## 2023-07-09 MED ORDER — KETOROLAC TROMETHAMINE 60 MG/2ML IM SOLN
60.0000 mg | Freq: Once | INTRAMUSCULAR | Status: AC
  Administered 2023-07-09: 60 mg via INTRAMUSCULAR
  Filled 2023-07-09: qty 2

## 2023-07-09 MED ORDER — IBUPROFEN 800 MG PO TABS: 800.0000 mg | ORAL_TABLET | Freq: Three times a day (TID) | ORAL | 0 refills | Status: AC | PRN

## 2023-07-09 NOTE — ED Triage Notes (Signed)
  Patient comes in with headache possibly related to wisdom teeth.  Patient states she has been taking tylenol  with last dose around 2100.  States her wisdom teeth on top/left hurt and has been told they need to be removed.  Pain 9/10, throbbing.

## 2023-07-09 NOTE — ED Provider Notes (Signed)
 [place] EMERGENCY DEPARTMENT AT [facility] Provider Note   CSN: [id number] Arrival date & time: 07/09/23  9488     Patient presents with: Headache and Dental Pain   Ariel Randolph is a 28 y.o. female.   The history is provided by the patient.  Headache Dental Pain Associated symptoms: headaches   Ariel Randolph is a 28 y.o. female who presents to the Emergency Department complaining of headache.  She presents to the emergency department for evaluation of waxing and waning headache that has been coinciding with dental pain for the last several months.  This episode recurred on Monday night.  She describes it as a bitemporal headache with associated photophobia.  No associated fever, nausea, vomiting, numbness, weakness.  Denies any chance of pregnancy.  She does have pain on her left upper wisdom tooth that seems to coincide with her headache.  It is a throbbing pain.  She does not have a dentist.     Prior to Admission medications   Medication Sig Start Date End Date Taking? Authorizing Provider  ibuprofen  (ADVIL ) 800 MG tablet Take 1 tablet (800 mg total) by mouth every 8 (eight) hours as needed. 07/09/23  Yes Griselda Norris, MD  penicillin v potassium (VEETID) 500 MG tablet Take 1 tablet (500 mg total) by mouth 4 (four) times daily for 7 days. 07/09/23 07/16/23 Yes Griselda Norris, MD  divalproex (DEPAKOTE ER) 500 MG 24 hr tablet Take 500 mg by mouth 2 (two) times daily. 06/28/16   [provider]  HYDROcodone -acetaminophen  (NORCO/VICODIN) 5-325 MG tablet Take 2 tablets by mouth every 6 (six) hours as needed. 02/14/23   Theotis Peers M, PA-C  lidocaine  (LIDODERM ) 5 % Place 1 patch onto the skin daily. Remove & Discard patch within 12 hours or as directed by MD 10/16/19   Henderly, Britni A, PA-C  methocarbamol  (ROBAXIN ) 500 MG tablet Take 1 tablet (500 mg total) by mouth 2 (two) times daily. 10/16/19   Henderly, Britni A, PA-C  metroNIDAZOLE  (FLAGYL ) 500 MG tablet  Take 500 mg by mouth 2 (two) times daily.    [provider]    Allergies: Oxycodone  and Codeine    Review of Systems  Neurological:  Positive for headaches.  All other systems reviewed and are negative.   Updated Vital Signs BP (!) 135/93 (BP Location: Right Arm)   Pulse 91   Temp 98.3 F (36.8 C) (Oral)   Resp 20   Ht 5' 8 (1.727 m)   Wt 79.8 kg   LMP 07/07/2023 (Approximate)   SpO2 100%   BMI 26.76 kg/m   Physical Exam Vitals and nursing note reviewed.  Constitutional:      Appearance: She is well-developed.  HENT:     Head: Normocephalic and atraumatic.     Comments: Tooth #16 is partially erupted without any local soft tissue swelling or erythema.  Eyes:     Extraocular Movements: Extraocular movements intact.     Pupils: Pupils are equal, round, and reactive to light.    Cardiovascular:     Rate and Rhythm: Normal rate and regular rhythm.  Pulmonary:     Effort: Pulmonary effort is normal. No respiratory distress.  Abdominal:     Tenderness: There is no rebound.   Musculoskeletal:        General: No tenderness.     Cervical back: Neck supple.   Skin:    General: Skin is warm and dry.   Neurological:     Mental Status:  She is alert and oriented to person, place, and time.     Comments: No asymmetry of facial movements.  Visual fields grossly intact.  5 out of 5 strength in all 4 extremities with sensation light touch intact in all 4 extremities  Psychiatric:        Behavior: Behavior normal.     (all labs ordered are listed, but only abnormal results are displayed) Labs Reviewed - No data to display  EKG: None  Radiology: No results found.   Procedures   Medications Ordered in the ED  ketorolac (TORADOL) injection 60 mg (60 mg Intramuscular Given 07/09/23 0634)                                    Medical Decision Making Risk Prescription drug management.   Patient here for evaluation of headache, dental pain.  She is  nontoxic-appearing on evaluation with no focal neurologic deficits.  Current clinical picture is not consistent with space-occupying lesion, subarachnoid hemorrhage, meningitis, dural sinus thrombosis.  She was treated with ketorolac for her pain with improvement in her symptoms.  In terms of her dental pain, she does have an impacted wisdom tooth, no definite evidence of abscess at this time.  Will place referral to dentistry.  Discussed with patient home care for headache, dental pain.  Discussed outpatient follow-up as well as return precautions.  Will prescribe as needed ibuprofen  for pain.  Discussed she may also continue acetaminophen  over-the-counter according to label instructions as needed.     Final diagnoses:  Pain, dental  Bad headache    ED Discharge Orders          Ordered    ibuprofen  (ADVIL ) 800 MG tablet  Every 8 hours PRN        07/09/23 0710    penicillin v potassium (VEETID) 500 MG tablet  4 times daily        07/09/23 0710               Griselda Norris, MD 07/09/23 [phone]

## 2024-01-14 ENCOUNTER — Other Ambulatory Visit: Payer: Self-pay

## 2024-01-14 ENCOUNTER — Emergency Department (HOSPITAL_BASED_OUTPATIENT_CLINIC_OR_DEPARTMENT_OTHER): Payer: MEDICAID

## 2024-01-14 ENCOUNTER — Emergency Department (HOSPITAL_BASED_OUTPATIENT_CLINIC_OR_DEPARTMENT_OTHER)
Admission: EM | Admit: 2024-01-14 | Discharge: 2024-01-14 | Disposition: A | Discharge status: Home / Self Care | Payer: MEDICAID

## 2024-01-14 DIAGNOSIS — S62317A Displaced fracture of base of fifth metacarpal bone. left hand, initial encounter for closed fracture: Secondary | ICD-10-CM | POA: Insufficient documentation

## 2024-01-14 DIAGNOSIS — S6992XA Unspecified injury of left wrist, hand and finger(s), initial encounter: Secondary | ICD-10-CM | POA: Diagnosis present

## 2024-01-14 DIAGNOSIS — S62339A Displaced fracture of neck of unspecified metacarpal bone, initial encounter for closed fracture: Secondary | ICD-10-CM

## 2024-01-14 DIAGNOSIS — W228XXA Striking against or struck by other objects, initial encounter: Secondary | ICD-10-CM | POA: Diagnosis not present

## 2024-01-14 MED ORDER — KETOROLAC TROMETHAMINE 15 MG/ML IJ SOLN
15.0000 mg | Freq: Once | INTRAMUSCULAR | Status: AC
  Administered 2024-01-14: 15 mg via INTRAMUSCULAR
  Filled 2024-01-14: qty 1

## 2024-01-14 MED ORDER — OXYCODONE HCL 5 MG PO TABS
5.0000 mg | ORAL_TABLET | Freq: Once | ORAL | Status: DC
  Filled 2024-01-14: qty 1

## 2024-01-14 NOTE — ED Provider Notes (Signed)
 " [facility] Provider Note   CSN: [id number] Arrival date & time: 01/14/24  1219     Patient presents with: Hand Injury   Laruth Co is a 28 y.o. female.   This is a 28 year old female presenting emergency department with left hand pain after punching a wall last night.  No numbness tingling changes in sensation.   Hand Injury      Prior to Admission medications  Medication Sig Start Date End Date Taking? Authorizing Provider  divalproex (DEPAKOTE ER) 500 MG 24 hr tablet Take 500 mg by mouth 2 (two) times daily. 06/28/16   [provider]  HYDROcodone -acetaminophen  (NORCO/VICODIN) 5-325 MG tablet Take 2 tablets by mouth every 6 (six) hours as needed. 02/14/23   Theotis Peers M, PA-C  ibuprofen  (ADVIL ) 800 MG tablet Take 1 tablet (800 mg total) by mouth every 8 (eight) hours as needed. 07/09/23   Griselda Norris, MD  lidocaine  (LIDODERM ) 5 % Place 1 patch onto the skin daily. Remove & Discard patch within 12 hours or as directed by MD 10/16/19   Henderly, Britni A, PA-C  methocarbamol  (ROBAXIN ) 500 MG tablet Take 1 tablet (500 mg total) by mouth 2 (two) times daily. 10/16/19   Henderly, Britni A, PA-C  metroNIDAZOLE  (FLAGYL ) 500 MG tablet Take 500 mg by mouth 2 (two) times daily.    [provider]    Allergies: Oxycodone  and Codeine    Review of Systems  Updated Vital Signs BP 124/66   Pulse (!) 111   Temp 98.8 F (37.1 C) (Oral)   Resp 15   Ht 5' 8 (1.727 m)   Wt 75.8 kg   LMP 12/23/2023 (Approximate)   SpO2 100%   BMI 25.39 kg/m   Physical Exam Vitals and nursing note reviewed.  Cardiovascular:     Rate and Rhythm: Normal rate and regular rhythm.  Abdominal:     General: Abdomen is flat.     Palpations: Abdomen is soft.  Musculoskeletal:     Comments: Neurovascularly intact in the left hand.  Tender to palpation over the fifth metacarpal.  Brisk cap refill.  Strong pulses.  Skin:     Capillary Refill: Capillary refill takes less than 2 seconds.  Neurological:     Mental Status: She is oriented to person, place, and time.  Psychiatric:        Mood and Affect: Mood normal.        Behavior: Behavior normal.     (all labs ordered are listed, but only abnormal results are displayed) Labs Reviewed - No data to display  EKG: None  Radiology: DG Hand Complete Left Result Date: 01/14/2024 CLINICAL DATA:  Trauma to the left hand. EXAM: LEFT HAND - COMPLETE 3+ VIEW COMPARISON:  None Available. FINDINGS: Mildly angulated fracture of the base of the fifth metacarpal. There is focal area of apparent cortical discontinuity involving the base of the second metacarpal which may be chronic. An acute fracture is not excluded. No dislocation. The bones are well mineralized. No arthritic changes. Soft tissue swelling over the medial hand. No radiopaque foreign object or soft tissue gas. IMPRESSION: 1. Mildly angulated fracture of the base of the fifth metacarpal. 2. Chronic changes versus possible nondisplaced fracture of the base of the second metacarpal. Electronically Signed   By: Vanetta Chou M.D.   On: 01/14/2024 13:58     Procedures   Medications Ordered in the ED  oxyCODONE  (Oxy IR/ROXICODONE ) immediate release tablet  5 mg (has no administration in time range)                                    Medical Decision Making 28 year old female presenting emergency department with left hand pain after punching a wall.  Vital signs reassuring.  Physical exam with tenderness to the fifth metacarpal.  X-ray with boxer's fracture.  Will give Percocet, friend will drive her home.  Placed in ulnar gutter, will follow-up with Ortho.  Stable for discharge at this time.  Return precautions given.  Amount and/or Complexity of Data Reviewed Radiology: ordered.  Risk Prescription drug management.      Final diagnoses:  Closed boxer's fracture, initial encounter    ED Discharge  Orders     None          Neysa Caron PARAS, DO 01/14/24 1423  "

## 2024-01-14 NOTE — ED Triage Notes (Signed)
 Pt reports punching wall last night with L hand. C/o L hand pain and swelling since.   Abrasions noted to R hand.   Took ibuprofen  appx 0900 today.

## 2024-01-14 NOTE — Discharge Instructions (Signed)
 Remain in the splint until you are seen by the orthopedic doctor.  Call to schedule an appointment.  Take Tylenol  alternate with ibuprofen  for pain.  Return for fevers, chills, finger become blue, cold, pale or conversely if they become red hot or swollen.  Return if you develop any new or worsening symptoms that are concerning to you.
